# Patient Record
Sex: Female | Born: 1965 | Hispanic: No | Marital: Single | Smoking: Current every day smoker
Health system: Southern US, Community
[De-identification: ages and names within clinical notes are randomized; demographics above are authoritative.]

---

## 2016-08-22 ENCOUNTER — Ambulatory Visit (INDEPENDENT_AMBULATORY_CARE_PROVIDER_SITE_OTHER): Payer: Self-pay | Admitting: Orthopedic Surgery

## 2016-09-05 ENCOUNTER — Ambulatory Visit (INDEPENDENT_AMBULATORY_CARE_PROVIDER_SITE_OTHER): Payer: BLUE CROSS/BLUE SHIELD | Admitting: Orthopedic Surgery

## 2016-09-05 ENCOUNTER — Encounter (INDEPENDENT_AMBULATORY_CARE_PROVIDER_SITE_OTHER): Payer: Self-pay | Admitting: Orthopedic Surgery

## 2016-09-05 ENCOUNTER — Encounter (INDEPENDENT_AMBULATORY_CARE_PROVIDER_SITE_OTHER): Payer: Self-pay

## 2016-09-05 ENCOUNTER — Ambulatory Visit (INDEPENDENT_AMBULATORY_CARE_PROVIDER_SITE_OTHER): Payer: BLUE CROSS/BLUE SHIELD

## 2016-09-05 DIAGNOSIS — M5441 Lumbago with sciatica, right side: Secondary | ICD-10-CM | POA: Diagnosis not present

## 2016-09-05 DIAGNOSIS — G8929 Other chronic pain: Secondary | ICD-10-CM | POA: Diagnosis not present

## 2016-09-05 NOTE — Addendum Note (Signed)
Addended byPrescott Parma: Jacqlyn Marolf on: 09/05/2016 10:35 AM   Modules accepted: Orders

## 2016-09-05 NOTE — Progress Notes (Signed)
Office Visit Note   Patient: Stacy Cross           Date of Birth: 1965/05/13           MRN: [id number] Visit Date: 09/05/2016 Requested by: Stacy Payor, MD No address on file PCP: Floydene Flock, NP  Subjective: Chief Complaint  Patient presents with  . Lower Back - Pain    HPI: Stacy Cross is a 51 year old female with low back pain.  Initial injury in 2012 when she was trying to keep the patient from falling.  She's had back pain since that time.  She has had epidural steroid injections but she had a small complication with 1 where both of her legs were numb but that resolved.  Her last epidural steroid injection was in 2012.  Currently she describes over 2 months of increasing back pain and right leg pain.  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  She has had trouble walking and the pain radiates down her left leg along with numbness and tingling.  Her old records are reviewed.  She is on prednisone for lupus and does have a history of low vitamin D for which she currently takes replacement vitamin D.  She also is on a heart monitor because of palpations and heart rhythm abnormalities Systane within the past week.  She takes baclofen for her problem without relief.  She is having some back spasms.  At times it is difficult for her to do her housework.              ROS: All systems reviewed are negative as they relate to the chief complaint within the history of present illness.  Patient denies  fevers or chills.   Assessment & Plan: Visit Diagnoses:  1. Chronic midline low back pain with right-sided sciatica     Plan: Impression is low back pain with scoliosis on plain radiographs.  She's had epidural steroid injections before with some relief but is wary of repeating that because of the last complication she had.  She does have pretty good reason to have some recurrent back symptoms with her history of 2 previous bulging disc by MRI scanning 2012.  That is likely  recurred.  She is on prednisone on a daily basis and thus medical management as our initial treatment has already been attempted  Symptoms have been going on now for over 2 months and it is interfering with her work.  She does have some nerve root tension signs on the right but not on the left.  MRI scanning is indicated to evaluate right-sided radiculopathy.  With her cardiac workup in progress I do not want to add any more medication to the mix.  Out of work for 2 weeks at least until we get the MRI scan.  I'll see her back after that study  Follow-Up Instructions: No Follow-up on file.   Orders:  Orders Placed This Encounter  Procedures  . XR Lumbar Spine 2-3 Views   No orders of the defined types were placed in this encounter.     Procedures: No procedures performed   Clinical Data: No additional findings.  Objective: Vital Signs: LMP  (LMP Unknown)   Physical Exam:   Constitutional: Patient appears well-developed HEENT:  Head: Normocephalic Eyes:EOM are normal Neck: Normal range of motion Cardiovascular: Normal rate Pulmonary/chest: Effort normal Neurologic: Patient is alert Skin: Skin is warm Psychiatric: Patient has normal mood and affect  Ortho Exam: Orthopedic exam demonstrates normal gait alignment palpable pedal pulses good ankle dorsi flexion plantar flexion strength.  No groin pain with internal/external rotation leg.  Does have nerve retention signs on the right negative on the left.  No paresthesias L1 S1 bilaterally.  Symmetric reflexes bilateral patella and Achilles.  Negative Babinski negative clonus.  No muscle atrophy in the legs.  5 out of 5 ankle dorsi and plantar flexion quite hamstring strength.  No pain with lateral bending does have some pain with forward bending.  No trochanteric tenderness is noted.  Does have some sciatic notch tenderness on the right compared to the left but no masses palpable in this region.  Specialty Comments:  No specialty  comments available.  Imaging: Xr Lumbar Spine 2-3 Views  Result Date: 09/05/2016 AP lateral lumbar spine reviewed.  Joint spaces maintained.  Patient does have scoliosis of the lumbar spine with the curve measuring approximately 15-20.  Mild facet degenerative changes noted in the lower lumbar spine.  No compression fracture noted.  Disc joint space is otherwise maintained.  No evidence of fracture    PMFS History: There are no active problems to display for this patient.  No past medical history on file.  No family history on file.  No past surgical history on file. Social History   Occupational History  . Not on file.   Social History Main Topics  . Smoking status: Current Every Day Smoker  . Smokeless tobacco: Never Used  . Alcohol use Not on file  . Drug use: Unknown  . Sexual activity: Not on file

## 2016-09-20 ENCOUNTER — Ambulatory Visit
Admission: RE | Admit: 2016-09-20 | Discharge: 2016-09-20 | Disposition: A | Discharge status: Home / Self Care | Payer: BLUE CROSS/BLUE SHIELD | Source: Ambulatory Visit | Attending: Orthopedic Surgery | Admitting: Orthopedic Surgery

## 2016-09-20 DIAGNOSIS — M5441 Lumbago with sciatica, right side: Principal | ICD-10-CM

## 2016-09-20 DIAGNOSIS — G8929 Other chronic pain: Secondary | ICD-10-CM

## 2016-09-24 ENCOUNTER — Encounter (INDEPENDENT_AMBULATORY_CARE_PROVIDER_SITE_OTHER): Payer: Self-pay | Admitting: Orthopedic Surgery

## 2016-09-24 ENCOUNTER — Ambulatory Visit (INDEPENDENT_AMBULATORY_CARE_PROVIDER_SITE_OTHER): Payer: BLUE CROSS/BLUE SHIELD | Admitting: Orthopedic Surgery

## 2016-09-24 DIAGNOSIS — M5136 Other intervertebral disc degeneration, lumbar region: Secondary | ICD-10-CM

## 2016-09-24 NOTE — Progress Notes (Signed)
   Office Visit Note   Patient: Stacy Cross           Date of Birth: 24-Aug-1965           MRN: [id number] Visit Date: 09/24/2016 Requested by: Floydene FlockJones, [place], NP [address] [place], [place] [id number] PCP: Floydene FlockJones, [place], NP  Subjective: Chief Complaint  Patient presents with  . Lower Back - Follow-up    HPI: Patient is a 51 year old female here to review MRI lumbar spine.  That scan is reviewed and there are some atypical finding at L5-S1 on the left-hand side with possible nerve root foraminal compromise.  This is either some type of anatomic variant or retained disc fragment.  He continues to have incapacitating and debilitating pain for 5 years worse over the last 6 months but really bad over the last 3 months.  She's considering applying for disability.  She works as a LawyerCNA.  Baclofen and Tylenol are not helping.  MRI the MRI radiologist recommended MRI with contrast to evaluate for H&P.              ROS: All systems reviewed are negative as they relate to the chief complaint within the history of present illness.  Patient denies  fevers or chills.   Assessment & Plan: Visit Diagnoses:  1. Lumbar degenerative disc disease     Plan: Impression is low back pain with a typical finding at L5-S1.  I reviewed the scan with the patient.  She needs MRI with contrast to evaluate this finding as well as referral to Dr. Alvester MorinNewton for left-sided L5-S1 foraminal injection for diagnostic and therapeutic purposes.  I'll see her back after those studies and interventions.  Follow-Up Instructions: Return for after MRI.   Orders:  Orders Placed This Encounter  Procedures  . MR LUMBAR SPINE W CONTRAST  . Ambulatory referral to Physical Medicine Rehab   No orders of the defined types were placed in this encounter.     Procedures: No procedures performed   Clinical Data: No additional findings.  Objective: Vital Signs: LMP  (LMP Unknown)   Physical Exam:   Constitutional: Patient appears  well-developed HEENT:  Head: Normocephalic Eyes:EOM are normal Neck: Normal range of motion Cardiovascular: Normal rate Pulmonary/chest: Effort normal Neurologic: Patient is alert Skin: Skin is warm Psychiatric: Patient has normal mood and affect    Ortho Exam: Orthopedic exam demonstrates some pain with forward lateral bending.  She does have groin pain and back pain on the right when I roll the left hip.  No real restriction of hip range of motion.  No definite paresthesias L1 S1.  Pedal pulses palpable.  She has good quad and hamstring strength ankle dorsi flexion plantar flexion strength.  Specialty Comments:  No specialty comments available.  Imaging: No results found.   PMFS History: There are no active problems to display for this patient.  No past medical history on file.  No family history on file.  No past surgical history on file. Social History   Occupational History  . Not on file.   Social History Main Topics  . Smoking status: Current Every Day Smoker  . Smokeless tobacco: Never Used  . Alcohol use Not on file  . Drug use: Unknown  . Sexual activity: Not on file

## 2016-09-27 ENCOUNTER — Ambulatory Visit
Admission: RE | Admit: 2016-09-27 | Discharge: 2016-09-27 | Disposition: A | Discharge status: Home / Self Care | Payer: BLUE CROSS/BLUE SHIELD | Source: Ambulatory Visit | Attending: Orthopedic Surgery | Admitting: Orthopedic Surgery

## 2016-09-27 DIAGNOSIS — M5136 Other intervertebral disc degeneration, lumbar region: Secondary | ICD-10-CM

## 2016-09-27 MED ORDER — GADOBENATE DIMEGLUMINE 529 MG/ML IV SOLN
20.0000 mL | Freq: Once | INTRAVENOUS | Status: AC | PRN
  Administered 2016-09-27: 20 mL via INTRAVENOUS

## 2016-10-08 ENCOUNTER — Ambulatory Visit (INDEPENDENT_AMBULATORY_CARE_PROVIDER_SITE_OTHER): Payer: BLUE CROSS/BLUE SHIELD | Admitting: Physical Medicine and Rehabilitation

## 2016-10-08 ENCOUNTER — Encounter (INDEPENDENT_AMBULATORY_CARE_PROVIDER_SITE_OTHER): Payer: Self-pay | Admitting: Physical Medicine and Rehabilitation

## 2016-10-08 ENCOUNTER — Ambulatory Visit (INDEPENDENT_AMBULATORY_CARE_PROVIDER_SITE_OTHER): Payer: BLUE CROSS/BLUE SHIELD

## 2016-10-08 ENCOUNTER — Ambulatory Visit (INDEPENDENT_AMBULATORY_CARE_PROVIDER_SITE_OTHER): Payer: BLUE CROSS/BLUE SHIELD | Admitting: Orthopedic Surgery

## 2016-10-08 VITALS — BP 128/89 | HR 64

## 2016-10-08 DIAGNOSIS — M5416 Radiculopathy, lumbar region: Secondary | ICD-10-CM | POA: Diagnosis not present

## 2016-10-08 DIAGNOSIS — M5116 Intervertebral disc disorders with radiculopathy, lumbar region: Secondary | ICD-10-CM | POA: Diagnosis not present

## 2016-10-08 MED ORDER — LIDOCAINE HCL (PF) 1 % IJ SOLN
2.0000 mL | Freq: Once | INTRAMUSCULAR | Status: AC
  Administered 2016-10-08: 2 mL

## 2016-10-08 MED ORDER — METHYLPREDNISOLONE ACETATE 80 MG/ML IJ SUSP
80.0000 mg | Freq: Once | INTRAMUSCULAR | Status: AC
  Administered 2016-10-08: 80 mg

## 2016-10-08 NOTE — Progress Notes (Deleted)
Lower back pain, right more than left. Right buttock pain. Numbness and tingling. Burning sharp pain. Movement causes pain to increase. Reports having shortness of breath due to pain with movement. Wants injection today. 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 Needs help with daily activities. Having swelling in left knee from trying not to put weight on right leg. Also has pain slightly higher around mid back area.He continues to have incapacitating and debilitating pain for 5 years worse over the last 6 months but really bad over the last 3 months.  She's considering applying for disability.  She works as a LawyerCNA.

## 2016-10-08 NOTE — Patient Instructions (Signed)

## 2016-10-11 ENCOUNTER — Encounter (INDEPENDENT_AMBULATORY_CARE_PROVIDER_SITE_OTHER): Payer: Self-pay | Admitting: Physical Medicine and Rehabilitation

## 2016-10-11 NOTE — Progress Notes (Signed)
Stacy Cross - 51 y.o. female MRN [id number]  Date of birth: [birth year]  Office Visit Note: Visit Date: 10/08/2016 PCP: Floydene Flock, NP Referred by: Floydene Flock, NP  Subjective: Chief Complaint  Patient presents with  . 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 She has had chronic worsening severe right hip and leg pain and low back pain for 5 years with worsening over the last 6 months and really worsening severely over the last 3 months. She has had good conservative care and has been followed by Dr. August Saucer in our office most recently. He obtain an MRI of the lumbar spine which showed an abnormal finding of the left foramen and they suggested repeat imaging with contrast. This has been completed and is reviewed below. We requested to see the patient in evaluation due to the fact that her pain was right-sided and there were no findings on MRI suggesting a right-sided lesion. She comes in today demanding to have an injection today because of her pain level being so high. We did go over the MRI today and did explain to her the findings with contrast. This is reviewed below. I did show her the pictures. She has basically a sequestered disc fragment from prior disc herniation likely from the L4-5 disc into the left foramen. He would not expect this to cause right-sided symptoms. She does have some broad bulging as well as some facet arthropathy but not severe. Her level of pain seems to be out of proportion on the right side for the imaging findings. She has felt weak but no focal weakness or foot drop. Exam was completed and does not show much in the way of focal issues. She has told Dr. August Saucer that she is thinking about applying for disability. She reports numbness and tingling and burning sharp pain on the right. She says movement causes the pain to increase. She reports that her pain is so severe that she's having shortness of breath. I asked her to follow  up with her primary doctor for the shortness of breath unless she was really sure that this was caused from the pain itself and she feels like it is. 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 She reports swelling in the left knee from trying not to put weight on the right leg. She has not spoken to Dr. August Saucer about the left knee. She reports also pain slightly higher around the mid back and across the shoulder blades. She has had muscle relaxers pain medication without relief.    Review of Systems  Constitutional: Negative for chills, fever, malaise/fatigue and weight loss.  HENT: Negative for hearing loss and sinus pain.   Eyes: Negative for blurred vision, double vision and photophobia.  Respiratory: Negative for cough and shortness of breath.   Cardiovascular: Negative for chest pain, palpitations and leg swelling.  Gastrointestinal: Negative for abdominal pain, nausea and vomiting.  Genitourinary: Negative for flank pain.  Musculoskeletal: Positive for back pain and joint pain. Negative for myalgias.       Right hip and leg pain  Skin: Negative for itching and rash.  Neurological: Positive for tingling. Negative for tremors, focal weakness and weakness.  Endo/Heme/Allergies: Negative.   Psychiatric/Behavioral: Negative for depression.  All other systems reviewed and are negative.  Otherwise per HPI.  Assessment & Plan: Visit Diagnoses:  1. Lumbar radiculopathy   2. Radiculopathy due  to lumbar intervertebral disc disorder     Plan: Findings:  Chronic history of predominantly right-sided low back pain referring into the buttock and hip and leg for 5 years with worsening over the last 3 months to the point of severity that she has shortness of breath and can't put her weight on her right leg. 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 MRI findings are interesting in that there is a left foraminal disc fragment or sequestered fragment at L5-S1. This should cause left-sided  symptoms are not right-sided symptoms. Her pain is clearly on the right. She does have some tightness of the hamstring on the right compared to left. She has good distal strength. She's really demanding injection today and I think from a diagnostic and therapeutic standpoint would be fine to do the injection today it is warranted. If it does not help her that I would recommend Dr. August Saucer probably look at spine surgery referral just from information standpoint or potentially regrouping with a good physical therapist. Alternative avenue would be CT myelogram to see if that looks anything different than the MRI. Injection was done today and dictated below.    Meds & Orders:  Meds ordered this encounter  Medications  . lidocaine (PF) (XYLOCAINE) 1 % injection 2 mL  . methylPREDNISolone acetate (DEPO-MEDROL) injection 80 mg    Orders Placed This Encounter  Procedures  . XR C-ARM NO REPORT  . Epidural Steroid injection    Follow-up: Return if symptoms worsen or fail to improve in 1 to 2 weeks, for Dr.  August Saucer.   Procedures: No procedures performed  Lumbar Epidural Steroid Injection - Interlaminar Approach with Fluoroscopic Guidance  Patient: Stacy Cross      Date of Birth: 08-Oct-1965 MRN: [id number] PCP: Floydene Flock, NP      Visit Date: 10/08/2016   Universal Protocol:    Date/Time: 06/23/182:37 PM  Consent Given By: the patient  Position: PRONE  Additional Comments: Vital signs were monitored before and after the procedure. Patient was prepped and draped in the usual sterile fashion. The correct patient, procedure, and site was verified.   Injection Procedure Details:  Procedure Site One Meds Administered:  Meds ordered this encounter  Medications  . lidocaine (PF) (XYLOCAINE) 1 % injection 2 mL  . methylPREDNISolone acetate (DEPO-MEDROL) injection 80 mg     Laterality: Right  Location/Site:  L5-S1  Needle size: 20 G  Needle type: Tuohy  Needle Placement: Paramedian  epidural  Findings:  -Contrast Used: 0.5 mL iohexol 180 mg iodine/mL   -Comments: Excellent flow of contrast into the epidural space.  Procedure Details: Using a paramedian approach from the side mentioned above, the region overlying the inferior lamina was localized under fluoroscopic visualization and the soft tissues overlying this structure were infiltrated with 4 ml. of 1% Lidocaine without Epinephrine. The Tuohy needle was inserted into the epidural space using a paramedian approach.   The epidural space was localized using loss of resistance along with lateral and bi-planar fluoroscopic views.  After negative aspirate for air, blood, and CSF, a 2 ml. volume of Isovue-250 was injected into the epidural space and the flow of contrast was observed. Radiographs were obtained for documentation purposes.    The injectate was administered into the level noted above.   Additional Comments:  The patient tolerated the procedure well Dressing: Band-Aid    Post-procedure details: Patient was observed during the procedure. Post-procedure instructions were reviewed.  Patient left the clinic in stable condition.  Clinical History: No specialty comments available.  She reports that she has been smoking.  She has never used smokeless tobacco. No results for input(s): HGBA1C, LABURIC in the last 8760 hours.  Objective:  VS:  HT:    WT:   BMI:     BP:128/89  HR:64bpm  TEMP: ( )  RESP:  Physical Exam  Constitutional: She is oriented to person, place, and time. She appears well-developed and well-nourished.  Eyes: Conjunctivae and EOM are normal. Pupils are equal, round, and reactive to light.  Cardiovascular: Normal rate and intact distal pulses.   Pulmonary/Chest: Effort normal.  Musculoskeletal:  Patient ambulates without aid with a antalgic gait to the right. She has no pain with hip rotation. She does have some tenderness over the right greater trochanter. She has 2+ muscle  stretch reflexes at the quadriceps and gastrocnemius and she has no clonus bilaterally. Slump test is equivocal with right hamstring tightness.  Neurological: She is alert and oriented to person, place, and time. She exhibits normal muscle tone.  Skin: Skin is warm and dry. No rash noted. No erythema.  Psychiatric: She has a normal mood and affect. Her behavior is normal.  Nursing note and vitals reviewed.   Ortho Exam Imaging: No results found.  Past Medical/Family/Surgical/Social History: Medications & Allergies reviewed per EMR There are no active problems to display for this patient.  History reviewed. No pertinent past medical history. History reviewed. No pertinent family history. History reviewed. No pertinent surgical history. Social History   Occupational History  . Not on file.   Social History Main Topics  . Smoking status: Current Every Day Smoker  . Smokeless tobacco: Never Used  . Alcohol use Not on file  . Drug use: Unknown  . Sexual activity: Not on file

## 2016-10-11 NOTE — Procedures (Signed)
Lumbar Epidural Steroid Injection - Interlaminar Approach with Fluoroscopic Guidance  Patient: Stacy Cross      Date of Birth: [birth year] MRN: [id number] PCP: Floydene FlockJones, [place], NP      Visit Date: 10/08/2016   Universal Protocol:    Date/Time: 06/23/182:37 PM  Consent Given By: the patient  Position: PRONE  Additional Comments: Vital signs were monitored before and after the procedure. Patient was prepped and draped in the usual sterile fashion. The correct patient, procedure, and site was verified.   Injection Procedure Details:  Procedure Site One Meds Administered:  Meds ordered this encounter  Medications  . lidocaine (PF) (XYLOCAINE) 1 % injection 2 mL  . methylPREDNISolone acetate (DEPO-MEDROL) injection 80 mg     Laterality: Right  Location/Site:  L5-S1  Needle size: 20 G  Needle type: Tuohy  Needle Placement: Paramedian epidural  Findings:  -Contrast Used: 0.5 mL iohexol 180 mg iodine/mL   -Comments: Excellent flow of contrast into the epidural space.  Procedure Details: Using a paramedian approach from the side mentioned above, the region overlying the inferior lamina was localized under fluoroscopic visualization and the soft tissues overlying this structure were infiltrated with 4 ml. of 1% Lidocaine without Epinephrine. The Tuohy needle was inserted into the epidural space using a paramedian approach.   The epidural space was localized using loss of resistance along with lateral and bi-planar fluoroscopic views.  After negative aspirate for air, blood, and CSF, a 2 ml. volume of Isovue-250 was injected into the epidural space and the flow of contrast was observed. Radiographs were obtained for documentation purposes.    The injectate was administered into the level noted above.   Additional Comments:  The patient tolerated the procedure well Dressing: Band-Aid    Post-procedure details: Patient was observed during the procedure. Post-procedure  instructions were reviewed.  Patient left the clinic in stable condition.

## 2016-10-13 ENCOUNTER — Telehealth (INDEPENDENT_AMBULATORY_CARE_PROVIDER_SITE_OTHER): Payer: Self-pay | Admitting: Orthopedic Surgery

## 2016-10-13 ENCOUNTER — Telehealth (INDEPENDENT_AMBULATORY_CARE_PROVIDER_SITE_OTHER): Payer: Self-pay | Admitting: Radiology

## 2016-10-13 MED ORDER — ACETAMINOPHEN-CODEINE #3 300-30 MG PO TABS: 1.0000 | ORAL_TABLET | Freq: Three times a day (TID) | ORAL | 0 refills | Status: DC | PRN

## 2016-10-13 NOTE — Telephone Encounter (Signed)
Please advise. Thanks.  

## 2016-10-13 NOTE — Telephone Encounter (Signed)
Please see note from patients call. I s/w her and she said pain is getting much worse and wants to know how long this is going to last. Please call her to discuss. Thanks.   rx called. Patient aware.

## 2016-10-13 NOTE — Telephone Encounter (Signed)
Please advise 

## 2016-10-13 NOTE — Telephone Encounter (Signed)
Patient called stating she had an injection with Dr. Alvester MorinNewton on Wednesday, 10/08/16.  Since injection she is having increased pain, pain radiating down legs, and balance is off.  Patient is requesting something for pain.  Patient was given option to come to office tomorrow, she stated she lives in [place] and could not come until next week, she wants a call back about medication and will then make appointment.  CB # [phone]

## 2016-10-13 NOTE — Telephone Encounter (Signed)
Copy of records mailed to patient °

## 2016-10-13 NOTE — Telephone Encounter (Signed)
Ok for t 3 1 po q 8 # 30 and have her f/u with fn if this is post injection related - may need to have fn call pls fw this to him thx

## 2016-10-14 ENCOUNTER — Telehealth (INDEPENDENT_AMBULATORY_CARE_PROVIDER_SITE_OTHER): Payer: Self-pay | Admitting: Physical Medicine and Rehabilitation

## 2016-10-14 NOTE — Telephone Encounter (Signed)
Please call her today and see how she is doing after the pain medication from Dr. August Saucerean. A message should've gotten to us is that of him, postprocedure. I reviewed the images from the injection and it was a very simple interlaminar epidural injection without any difficulty or problems. If she is having a lot of symptoms when we call she will either need to be seen by us as quickly she can or she will need new MRI of the lumbar spine. Ultimately she is going to need to be seen by Dr. Ophelia CharterYates or Dr. Otelia SergeantNitka.

## 2016-10-14 NOTE — Telephone Encounter (Signed)
I called the patient, and she said she is having leg pain and problems with balance. I offered to work her in today, but she cannot get to [place]. I worked her in tomorrow morning at [id number]. She has not picked up her rx yet. I advised her that if the pain continues to worsen or she is concerned she should try to be seen in ED.

## 2016-10-15 ENCOUNTER — Ambulatory Visit (INDEPENDENT_AMBULATORY_CARE_PROVIDER_SITE_OTHER): Payer: BLUE CROSS/BLUE SHIELD | Admitting: Physical Medicine and Rehabilitation

## 2016-10-15 NOTE — Telephone Encounter (Signed)
thanks

## 2016-10-23 ENCOUNTER — Ambulatory Visit (INDEPENDENT_AMBULATORY_CARE_PROVIDER_SITE_OTHER): Payer: BLUE CROSS/BLUE SHIELD | Admitting: Orthopedic Surgery

## 2016-10-27 ENCOUNTER — Telehealth (INDEPENDENT_AMBULATORY_CARE_PROVIDER_SITE_OTHER): Payer: Self-pay

## 2016-10-27 MED ORDER — ACETAMINOPHEN-CODEINE #3 300-30 MG PO TABS: 1.0000 | ORAL_TABLET | Freq: Three times a day (TID) | ORAL | 0 refills | Status: AC | PRN

## 2016-10-27 NOTE — Telephone Encounter (Signed)
Patient request new prescription for T#3 #30 tablets. Ok to fill? [facility] [address] [place]

## 2016-10-27 NOTE — Telephone Encounter (Signed)
ok 

## 2016-10-27 NOTE — Telephone Encounter (Signed)
Called to pharmacy Advised patient.

## 2016-10-27 NOTE — Addendum Note (Signed)
Addended byPrescott Parma: Hoa Briggs on: 10/27/2016 04:49 PM   Modules accepted: Orders

## 2017-03-17 ENCOUNTER — Telehealth (INDEPENDENT_AMBULATORY_CARE_PROVIDER_SITE_OTHER): Payer: Self-pay | Admitting: Orthopedic Surgery

## 2017-03-17 NOTE — Telephone Encounter (Signed)
RECORDS FAXED DDS [phone]

## 2017-11-14 IMAGING — MR MR LUMBAR SPINE W/ CM
2 series · 28 of 47 positions shown · IV contrast (multihance)
Comparison: Noncontrast lumbar MRI 09/20/2016.

CLINICAL DATA: 51-year-old female status post noncontrast MRI
recently revealing space-occupying mass on the left at L5-S1 along
the course of the descending and exiting left L5 nerve.
Post-contrast imaging recommended.

EXAM:
MRI LUMBAR SPINE WITH CONTRAST
TECHNIQUE: Multiplanar post-contrast T1 weighted imaging of the lumbar spine.
CONTRAST:  20mL MULTIHANCE GADOBENATE DIMEGLUMINE 529 MG/ML IV SOLN

[Series 3: T1 fat-sat post-contrast · sagittal · 4.0mm · 0.88mm/px · 13 of 13 slices shown]
[im 1/13]
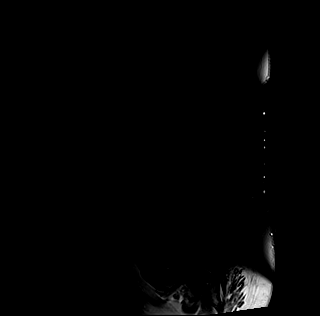
[im 2/13]
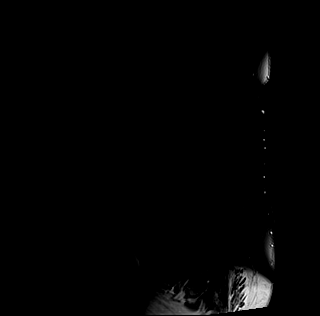
[im 3/13]
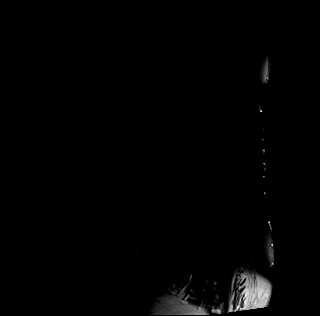
[im 4/13]
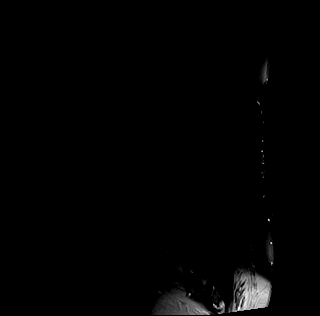
[im 5/13]
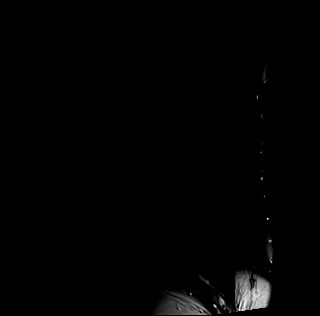
[im 6/13]
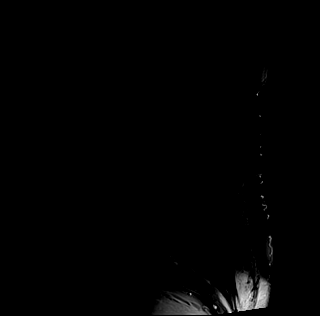
[im 7/13]
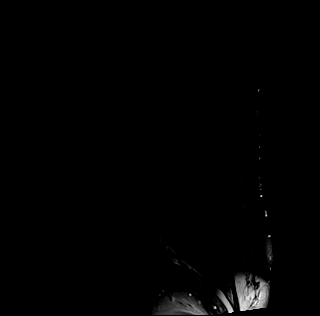
[im 8/13]
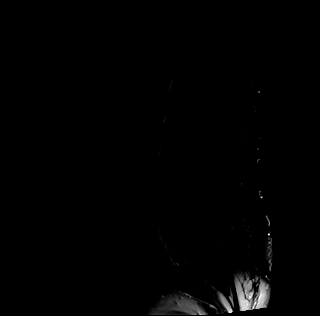
[im 9/13]
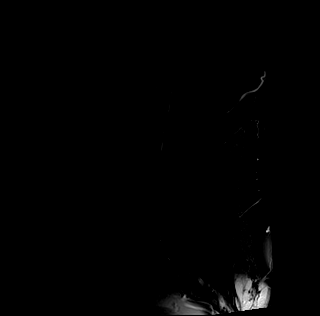
[im 10/13]
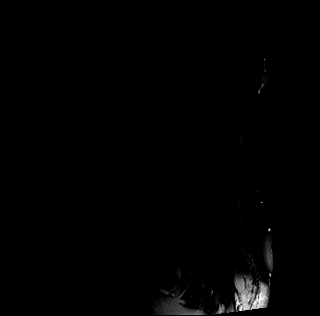
[im 11/13]
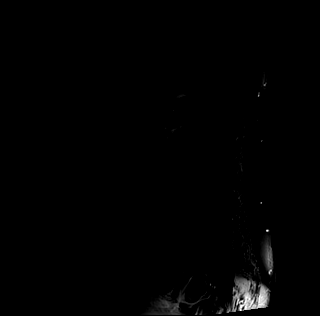
[im 12/13]
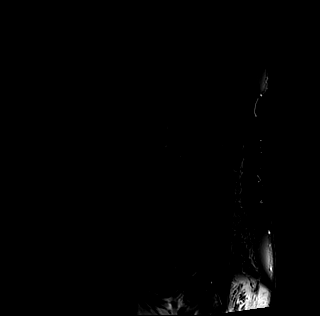
[im 13/13]
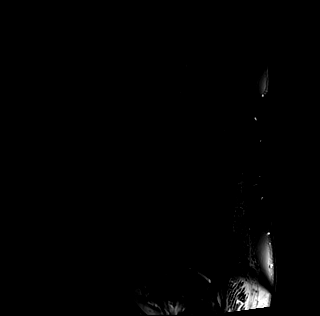

[Series 4: T1 post-contrast · axial · 4.0mm · 0.35mm/px · z∈[-56,+121]mm · 15 of 34 slices shown]
[im 1/34]
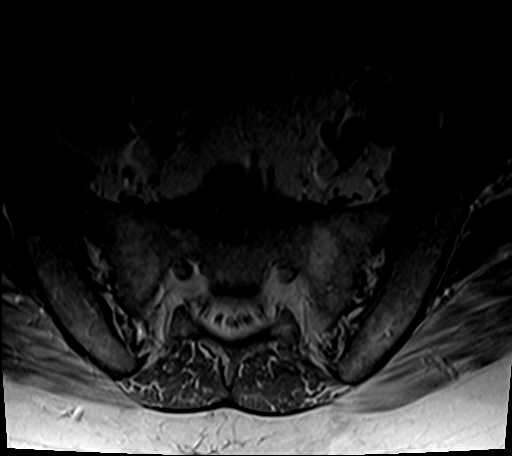
[im 2/34]
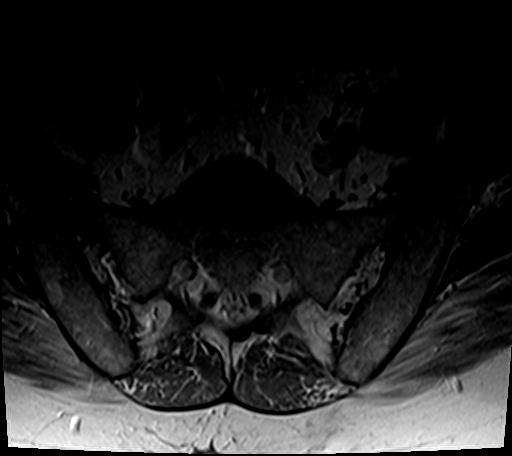
[im 3/34]
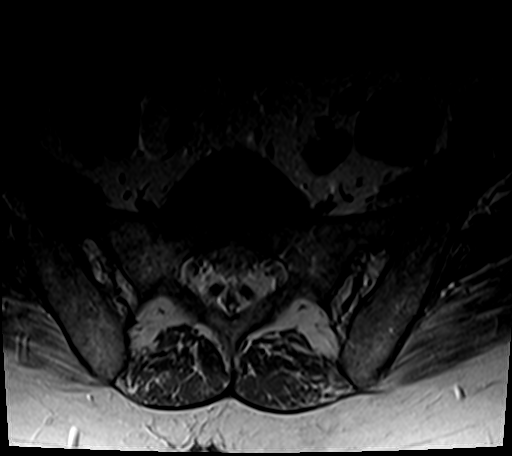
[im 4/34]
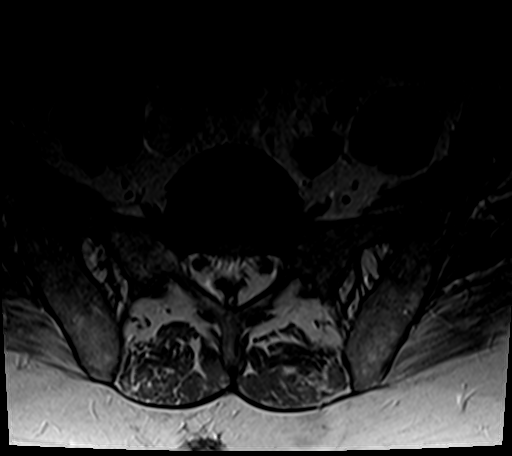
[im 5/34]
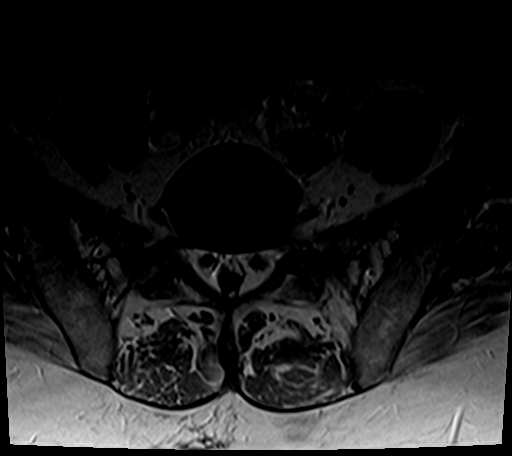
[im 6/34]
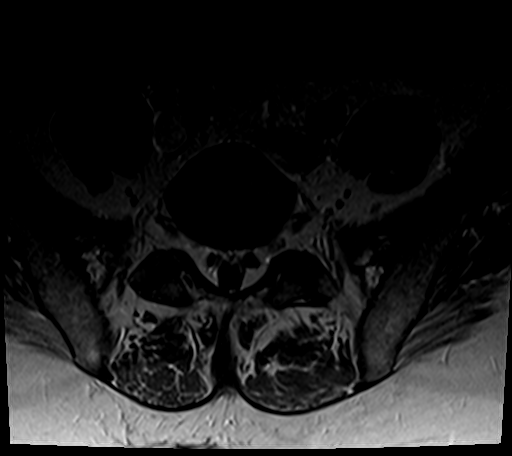
[im 7/34]
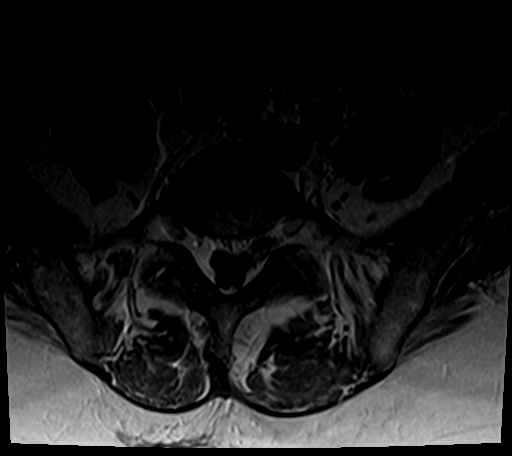
[im 11/34]
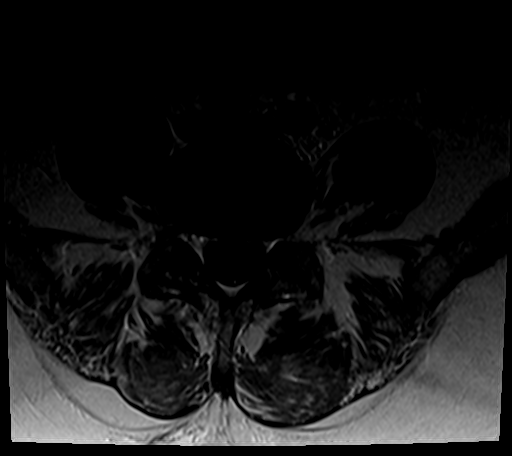
[im 15/34]
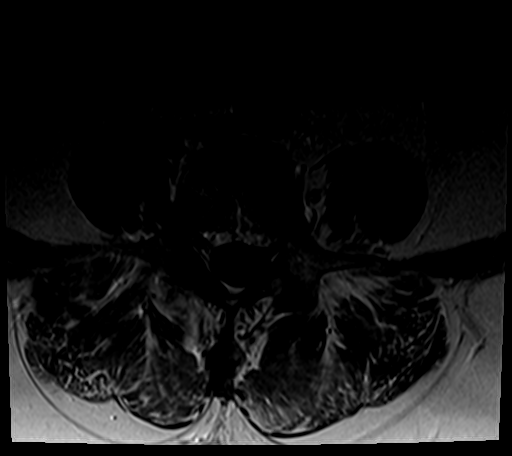
[im 18/34]
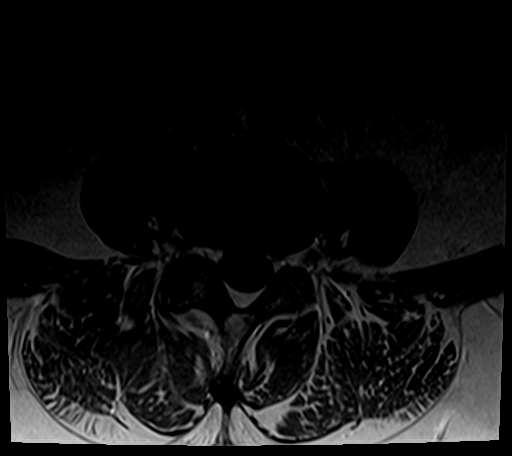
[im 20/34]
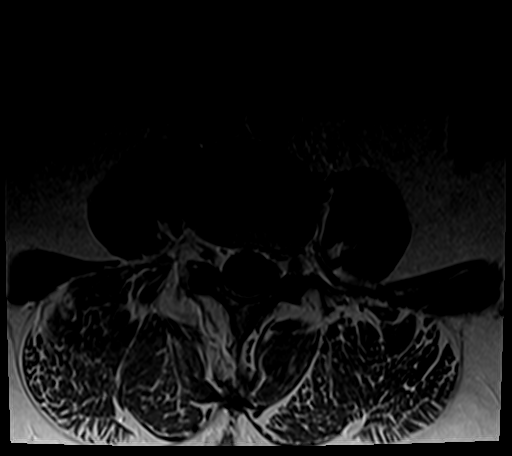
[im 24/34]
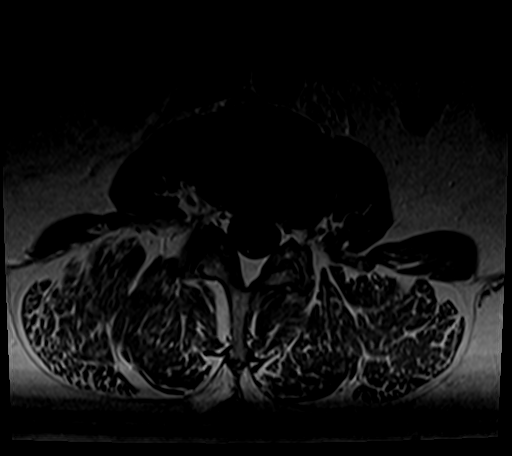
[im 28/34]
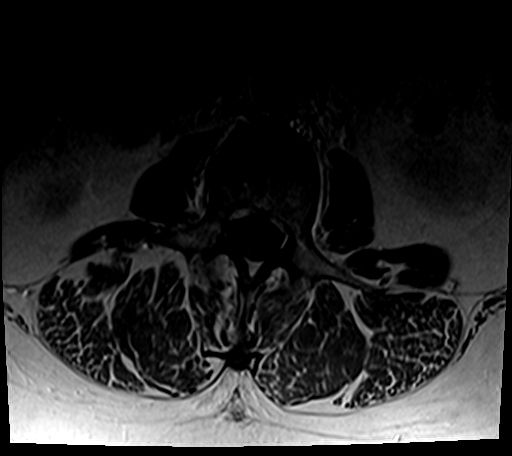
[im 29/34]
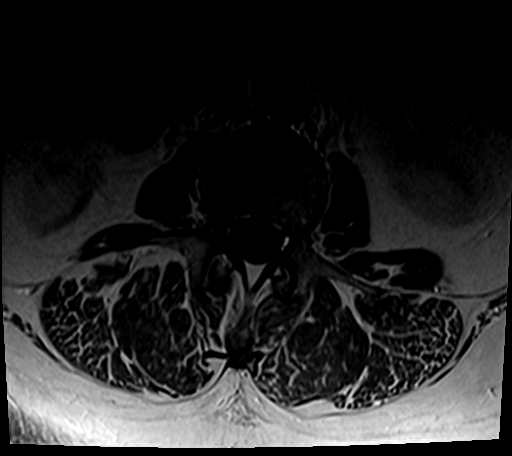
[im 32/34]
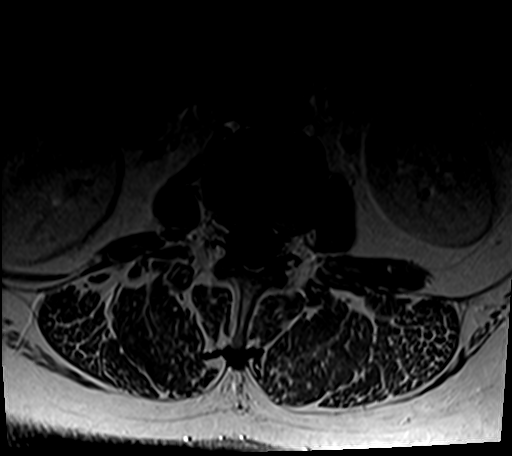

[28 of 47 positions shown; findings below may reference images not displayed]

FINDINGS: Post-contrast sagittal and axial T1 weighted imaging of the lumbar
spine. The rounded slightly lobulated space-occupying left lateral
recess and subarticular mass is re- demonstrated at L5-S1. This
lesion has only minimal stippled enhancement following contrast
(series 3, image 8 and series 4 image 27. The lesion was T1 and T2
hypointense and STIR hyperintense on the recent noncontrast study.
No interval change in size or configuration.

No lumbar dural thickening or enhancement. No abnormal intradural
enhancement. Negative visualized abdominal viscera.
IMPRESSION: 1. Space-occupying left lateral recess and subarticular mass at the
L5 vertebral level is stable in size and configuration with minimal
if any postcontrast enhancement.
The constellation of findings favors a sequestered disc fragment.
Query left L5 and/or S1 radiculitis. It is unclear whether this
fragment originated from the L4-L5 (slightly favored) or L5-S1 disc.
Recommend spine Surgery consultation.
2. Normal postcontrast enhancement of the lumbar spine otherwise.
